# Patient Record
Sex: Male | Born: 1965 | Race: Black or African American | Hispanic: No | Marital: Single | Smoking: Never smoker
Health system: Southern US, Community
[De-identification: ages and names within clinical notes are randomized; demographics above are authoritative.]

---

## 2005-03-24 ENCOUNTER — Ambulatory Visit (HOSPITAL_COMMUNITY): Admission: RE | Admit: 2005-03-24 | Discharge: 2005-03-24 | Payer: Self-pay | Admitting: General Surgery

## 2005-08-10 ENCOUNTER — Emergency Department (HOSPITAL_COMMUNITY): Admission: EM | Admit: 2005-08-10 | Discharge: 2005-08-10 | Payer: Self-pay | Admitting: Emergency Medicine

## 2005-08-15 ENCOUNTER — Emergency Department (HOSPITAL_COMMUNITY): Admission: EM | Admit: 2005-08-15 | Discharge: 2005-08-15 | Payer: Self-pay | Admitting: Emergency Medicine

## 2006-04-29 ENCOUNTER — Ambulatory Visit: Payer: Self-pay | Admitting: Family Medicine

## 2006-04-30 ENCOUNTER — Ambulatory Visit: Payer: Self-pay | Admitting: Family Medicine

## 2006-05-06 ENCOUNTER — Ambulatory Visit: Payer: Self-pay | Admitting: Gastroenterology

## 2006-05-08 ENCOUNTER — Ambulatory Visit: Payer: Self-pay | Admitting: Family Medicine

## 2006-05-11 ENCOUNTER — Ambulatory Visit: Payer: Self-pay | Admitting: Family Medicine

## 2006-05-14 ENCOUNTER — Ambulatory Visit (HOSPITAL_COMMUNITY): Admission: RE | Admit: 2006-05-14 | Discharge: 2006-05-14 | Payer: Self-pay | Admitting: *Deleted

## 2006-05-29 ENCOUNTER — Ambulatory Visit: Payer: Self-pay | Admitting: Gastroenterology

## 2006-06-12 ENCOUNTER — Ambulatory Visit: Payer: Self-pay | Admitting: Gastroenterology

## 2006-07-30 DIAGNOSIS — K921 Melena: Secondary | ICD-10-CM

## 2008-07-23 IMAGING — US US RENAL
1 series · 14 of 25 positions shown · non-contrast
Comparison: none

CLINICAL DATA: Chronic renal failure. 
 RENAL ULTRASOUND:

[Series 1: unknown · 0.33mm/px · 14 of 25 slices shown]
[im 1/25]
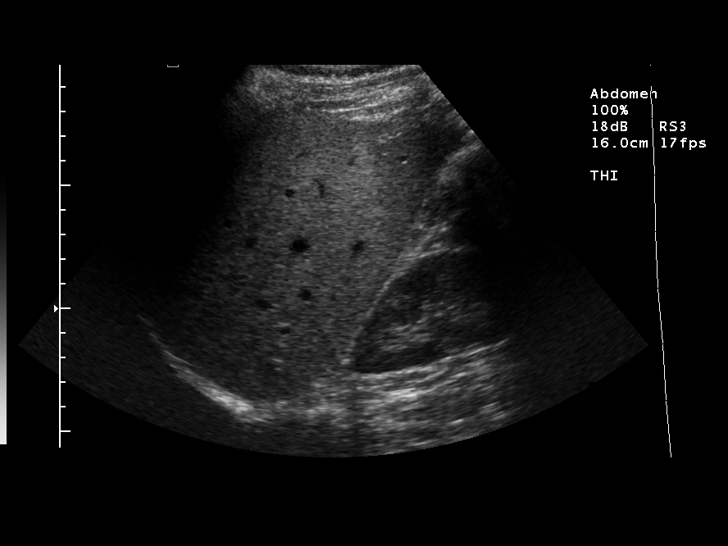
[im 3/25]
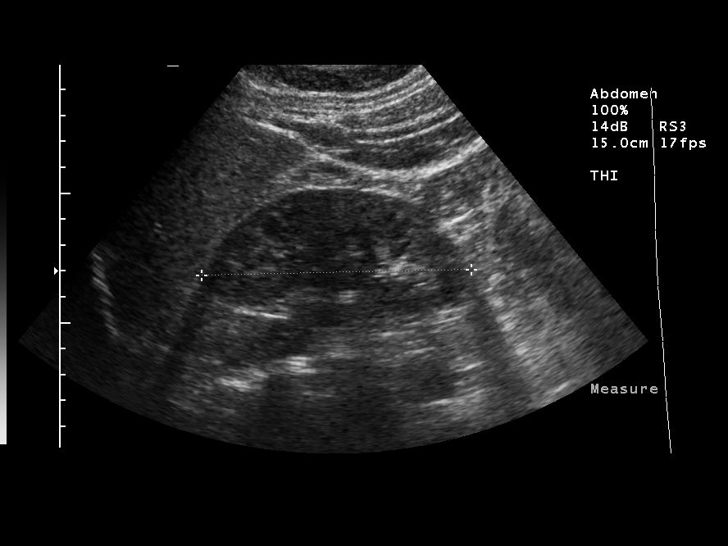
[im 5/25]
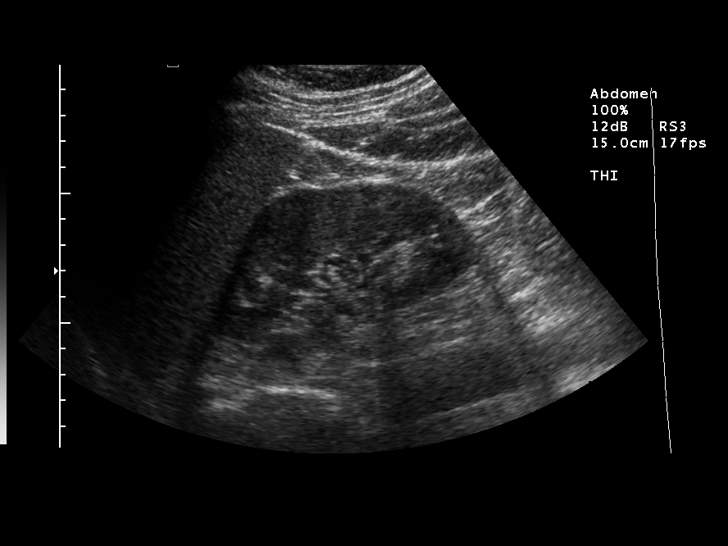
[im 7/25]
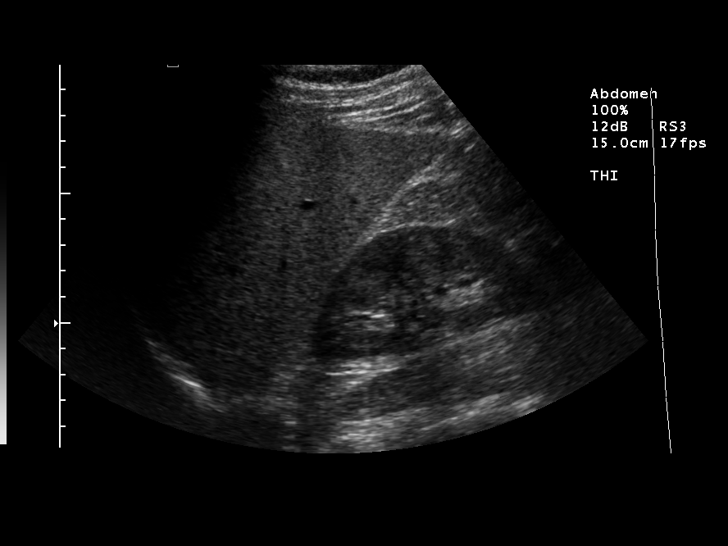
[im 9/25]
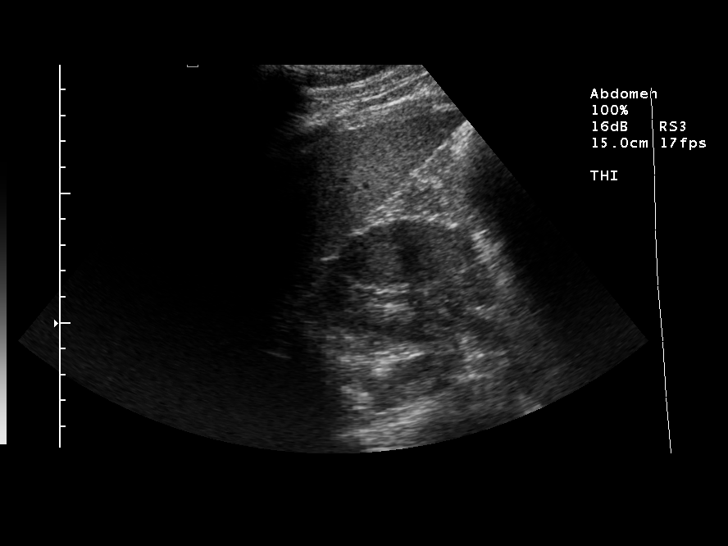
[im 10/25]
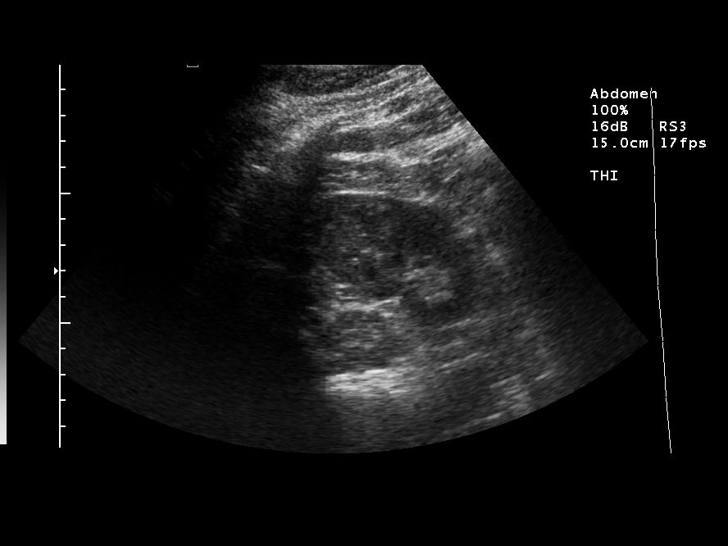
[im 12/25]
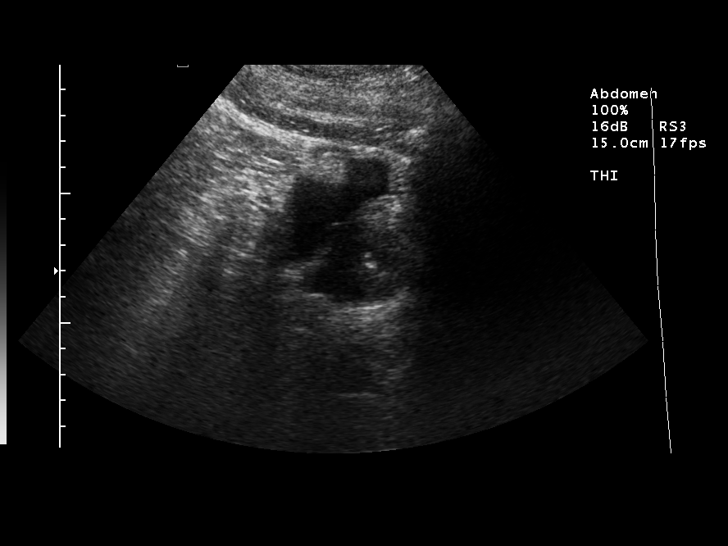
[im 14/25]
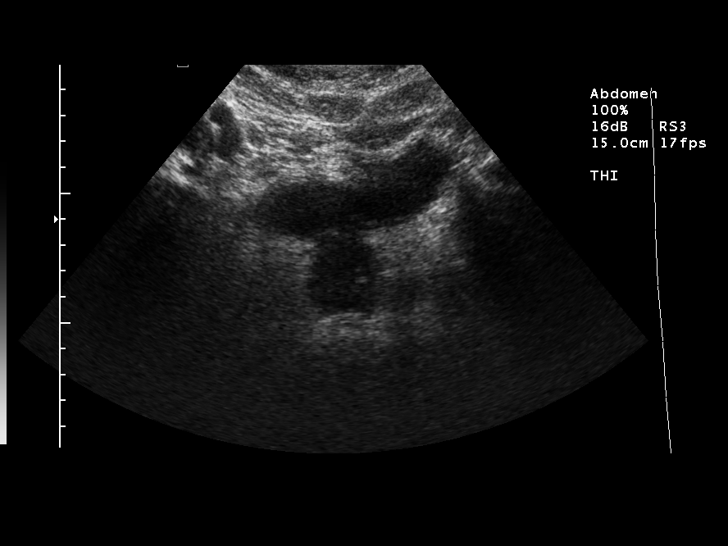
[im 16/25]
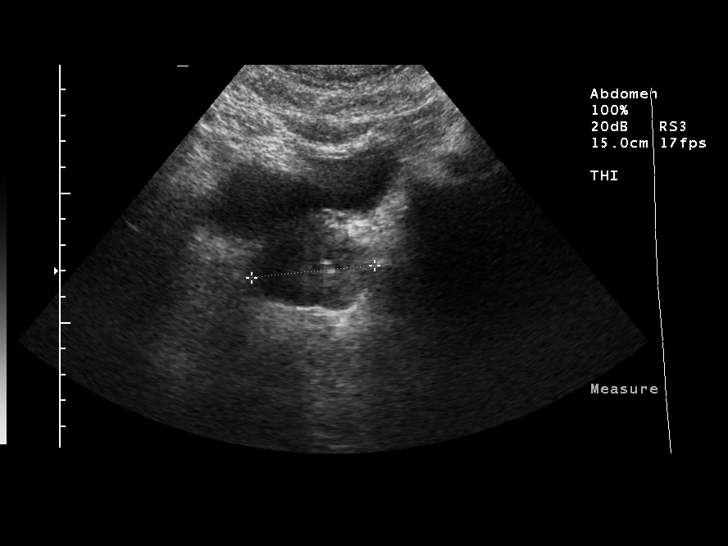
[im 17/25]
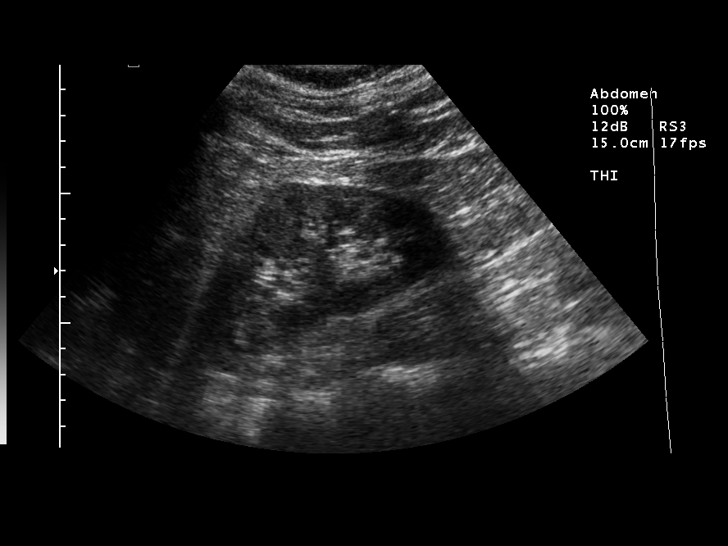
[im 19/25]
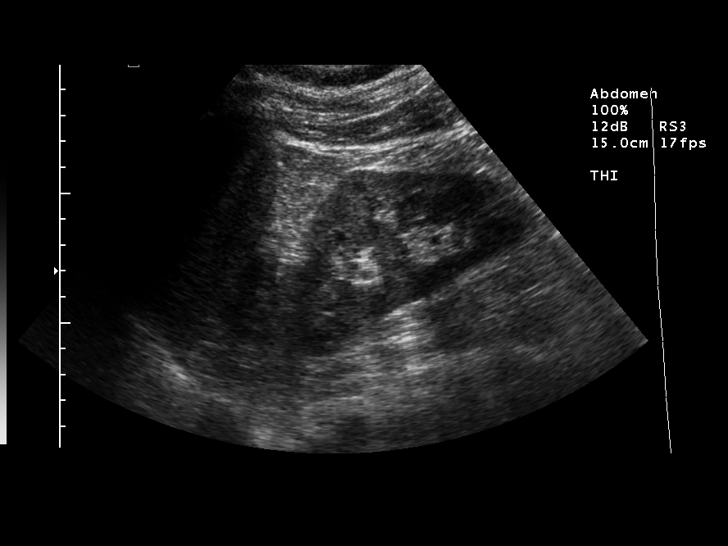
[im 21/25]
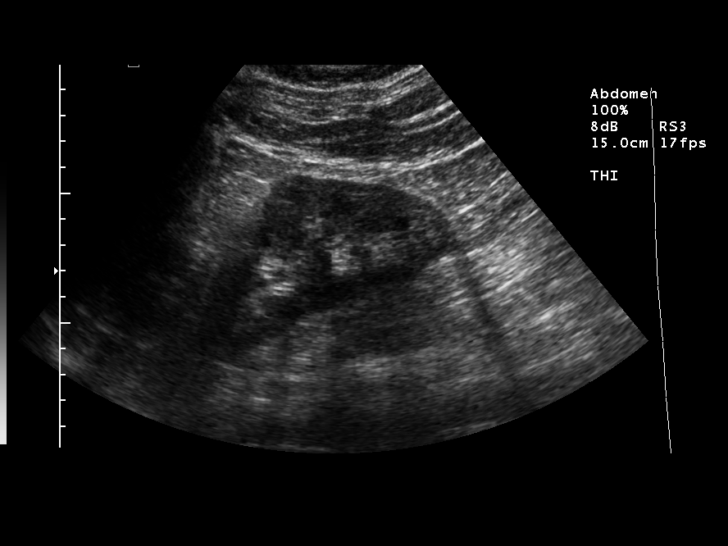
[im 23/25]
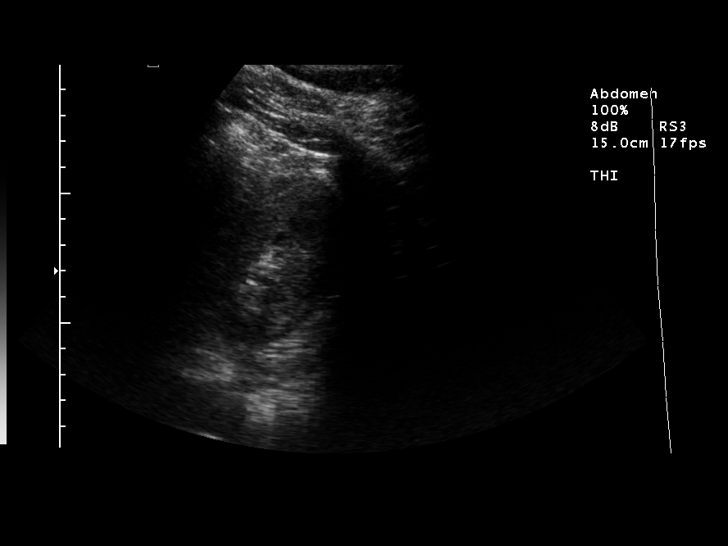
[im 25/25]
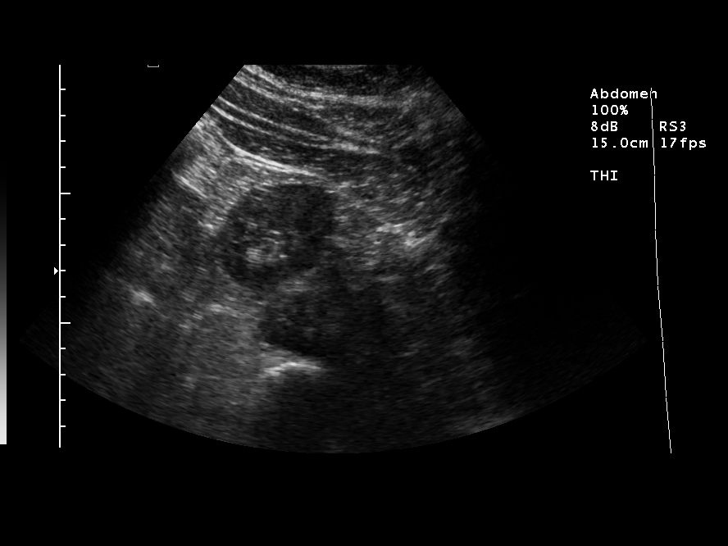

[14 of 25 positions shown; findings below may reference images not displayed]

FINDINGS: The right and left kidneys measure 10.6 cm and 10.7 cm in length, respectively. No mass or hydronephrosis. Renal parenchymal echogenicity appears within normal limits. Enlarged prostate gland measuring 3.4 x 4.2 x 4.8 cm.  The bladder is difficult to evaluate because it is not distended.
IMPRESSION: Renal ultrasound within normal limits. Enlarged prostate gland.

## 2014-12-14 ENCOUNTER — Encounter: Payer: Self-pay | Admitting: Gastroenterology

## 2015-02-12 ENCOUNTER — Ambulatory Visit (INDEPENDENT_AMBULATORY_CARE_PROVIDER_SITE_OTHER): Payer: Self-pay | Admitting: Emergency Medicine

## 2015-02-12 VITALS — BP 142/84 | HR 77 | Temp 98.4°F | Resp 18 | Ht 71.5 in | Wt 222.0 lb

## 2015-02-12 DIAGNOSIS — Z024 Encounter for examination for driving license: Secondary | ICD-10-CM

## 2015-02-12 DIAGNOSIS — Z021 Encounter for pre-employment examination: Secondary | ICD-10-CM

## 2015-02-12 NOTE — Progress Notes (Signed)
Subjective:  Patient ID: Carlos Weeks, male    DOB: January 09, 1966  Age: 49 y.o. MRN: [id number]  CC: Annual Exam   HPI Carlos Weeks presents  DOT  History Sallie has no past medical history on file.   He has no past surgical history on file.   His  family history includes Diabetes in his father; Heart attack in his father; Hypertension in his mother.  He   reports that he has never smoked. He does not have any smokeless tobacco history on file. He reports that he does not drink alcohol or use illicit drugs.  No outpatient prescriptions prior to visit.   No facility-administered medications prior to visit.    Social History   Social History  . Marital Status: Single    Spouse Name: N/A  . Number of Children: N/A  . Years of Education: N/A   Social History Main Topics  . Smoking status: Never Smoker   . Smokeless tobacco: None  . Alcohol Use: No  . Drug Use: No  . Sexual Activity: No   Other Topics Concern  . None   Social History Narrative  . None     Review of Systems  Constitutional: Negative for fever, chills and appetite change.  HENT: Negative for congestion, ear pain, postnasal drip, sinus pressure and sore throat.   Eyes: Negative for pain and redness.  Respiratory: Negative for cough, shortness of breath and wheezing.   Cardiovascular: Negative for leg swelling.  Gastrointestinal: Negative for nausea, vomiting, abdominal pain, diarrhea, constipation and blood in stool.  Endocrine: Negative for polyuria.  Genitourinary: Negative for dysuria, urgency, frequency and flank pain.  Musculoskeletal: Negative for gait problem.  Skin: Negative for rash.  Neurological: Negative for weakness and headaches.  Psychiatric/Behavioral: Negative for confusion and decreased concentration. The patient is not nervous/anxious.     Objective:  Pulse 77  Temp(Src) 98.4 F (36.9 C) (Oral)  Resp 18  Ht 5' 11.5" (1.816 m)  Wt 222 lb (100.699 kg)  BMI  30.53 kg/m2  SpO2 98%  Physical Exam  Constitutional: He is oriented to person, place, and time. He appears well-developed and well-nourished. No distress.  HENT:  Head: Normocephalic and atraumatic.  Right Ear: External ear normal.  Left Ear: External ear normal.  Nose: Nose normal.  Eyes: Conjunctivae and EOM are normal. Pupils are equal, round, and reactive to light. No scleral icterus.  Neck: Normal range of motion. Neck supple. No tracheal deviation present.  Cardiovascular: Normal rate, regular rhythm and normal heart sounds.   Pulmonary/Chest: Effort normal. No respiratory distress. He has no wheezes. He has no rales.  Abdominal: He exhibits no mass. There is no tenderness. There is no rebound and no guarding.  Musculoskeletal: He exhibits no edema.  Lymphadenopathy:    He has no cervical adenopathy.  Neurological: He is alert and oriented to person, place, and time. Coordination normal.  Skin: Skin is warm and dry. No rash noted.  Psychiatric: He has a normal mood and affect. His behavior is normal.      Assessment & Plan:   Audry was seen today for annual exam.  Diagnoses and all orders for this visit:  Encounter for commercial driver medical examination (CDME)   I am having Mr. Derner maintain his naproxen sodium.  Meds ordered this encounter  Medications  . naproxen sodium (ANAPROX) 220 MG tablet    Sig: Take 220 mg by mouth 2 (two) times daily with a meal.  Appropriate red flag conditions were discussed with the patient as well as actions that should be taken.  Patient expressed his understanding.  Follow-up: Return if symptoms worsen or fail to improve.  Roselee Culver, MD

## 2015-05-31 ENCOUNTER — Other Ambulatory Visit (HOSPITAL_COMMUNITY)
Admission: RE | Admit: 2015-05-31 | Discharge: 2015-05-31 | Disposition: A | Discharge status: Home / Self Care | Payer: Self-pay | Source: Ambulatory Visit | Attending: Family Medicine | Admitting: Family Medicine

## 2015-05-31 ENCOUNTER — Encounter (HOSPITAL_COMMUNITY): Payer: Self-pay | Admitting: Emergency Medicine

## 2015-05-31 ENCOUNTER — Emergency Department (INDEPENDENT_AMBULATORY_CARE_PROVIDER_SITE_OTHER)
Admission: EM | Admit: 2015-05-31 | Discharge: 2015-05-31 | Disposition: A | Discharge status: Home / Self Care | Payer: Self-pay | Source: Home / Self Care | Attending: Family Medicine | Admitting: Family Medicine

## 2015-05-31 DIAGNOSIS — Z113 Encounter for screening for infections with a predominantly sexual mode of transmission: Secondary | ICD-10-CM | POA: Insufficient documentation

## 2015-05-31 DIAGNOSIS — N453 Epididymo-orchitis: Secondary | ICD-10-CM

## 2015-05-31 DIAGNOSIS — N451 Epididymitis: Secondary | ICD-10-CM

## 2015-05-31 DIAGNOSIS — N452 Orchitis: Secondary | ICD-10-CM

## 2015-05-31 LAB — POCT URINALYSIS DIP (DEVICE)
Bilirubin Urine: NEGATIVE
Glucose, UA: 500 mg/dL — AB
Ketones, ur: NEGATIVE mg/dL
LEUKOCYTES UA: NEGATIVE
NITRITE: NEGATIVE
PH: 5.5 (ref 5.0–8.0)
Protein, ur: 100 mg/dL — AB
Specific Gravity, Urine: 1.02 (ref 1.005–1.030)
UROBILINOGEN UA: 0.2 mg/dL (ref 0.0–1.0)

## 2015-05-31 MED ORDER — LIDOCAINE HCL (PF) 1 % IJ SOLN
INTRAMUSCULAR | Status: AC
  Filled 2015-05-31: qty 5

## 2015-05-31 MED ORDER — CEFTRIAXONE SODIUM 1 G IJ SOLR
1.0000 g | Freq: Once | INTRAMUSCULAR | Status: AC
  Administered 2015-05-31: 1 g via INTRAMUSCULAR

## 2015-05-31 MED ORDER — CEFTRIAXONE SODIUM 1 G IJ SOLR
INTRAMUSCULAR | Status: AC
  Filled 2015-05-31: qty 10

## 2015-05-31 MED ORDER — CIPROFLOXACIN HCL 500 MG PO TABS: 500.0000 mg | ORAL_TABLET | Freq: Two times a day (BID) | ORAL | Status: DC

## 2015-05-31 NOTE — ED Notes (Signed)
Left testicular pain and inflammation per patient.  Onset 2 days ago of symptoms.

## 2015-05-31 NOTE — ED Provider Notes (Signed)
CSN: [id number]     Arrival date & time 05/31/15  1515 History   First MD Initiated Contact with Patient 05/31/15 1800     Chief Complaint  Patient presents with  . Testicle Pain   (Consider location/radiation/quality/duration/timing/severity/associated sxs/prior Treatment) HPI Comments: 49 year old male with gradual onset of left testicular pain for 2 days. This was not an acute event. It is worse with movement, ambulation, lying down with legs crossed. Occasionally he will have mild dysuria. Denies frequency. Denies penile discharge. Denies trauma or any known injury. The pain is much improved when lying on his back and legs are separated. 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 These types of movement exacerbates the pain.    History reviewed. No pertinent past medical history. History reviewed. No pertinent past surgical history. Family History  Problem Relation Age of Onset  . Hypertension Mother   . Diabetes Father   . Heart attack Father    Social History  Substance Use Topics  . Smoking status: Never Smoker   . Smokeless tobacco: None  . Alcohol Use: No    Review of Systems  Constitutional: Positive for fever.  HENT: Negative.   Respiratory: Negative.   Genitourinary: Positive for dysuria, scrotal swelling and testicular pain. Negative for urgency, frequency, hematuria, discharge, penile swelling and penile pain.  Musculoskeletal: Negative.   Skin: Negative for rash.  Neurological: Negative.   Psychiatric/Behavioral: Negative.   All other systems reviewed and are negative.   Allergies  Review of patient's allergies indicates no known allergies.  Home Medications   Prior to Admission medications   Medication Sig Start Date End Date Taking? Authorizing Provider  ciprofloxacin (CIPRO) 500 MG tablet Take 1 tablet (500 mg total) by  mouth 2 (two) times daily. 05/31/15   Hayden Rasmussen, NP   Meds Ordered and Administered this Visit   Medications  cefTRIAXone (ROCEPHIN) injection 1 g (not administered)    BP 166/102 mmHg  Pulse 105  Temp(Src) 101.1 F (38.4 C) (Oral)  Resp 16  SpO2 99% No data found.   Physical Exam  Constitutional: He is oriented to person, place, and time. He appears well-developed and well-nourished. No distress.  Neck: Normal range of motion. Neck supple.  Cardiovascular: Normal rate.   Pulmonary/Chest: Effort normal. No respiratory distress.  Genitourinary: Penis normal. No penile tenderness.  Right testicle of normal size and configuration. Nontender. No tenderness to the right epididymal apparatus. Left testicle enlarged approx twice normal size, firm and exquisitely tender. There is also tenderness to the epididymal apparatus and to the posterior aspect of the left testicle.  No inguinal bulging or palpated mass. No inguinal lymphadenopathy.  Musculoskeletal: He exhibits no edema.  Neurological: He is alert and oriented to person, place, and time.  Skin: Skin is warm and dry.  Psychiatric: He has a normal mood and affect. His behavior is normal.  Nursing note and vitals reviewed.   ED Course  Procedures (including critical care time)  Labs Review Labs Reviewed  POCT URINALYSIS DIP (DEVICE) - Abnormal; Notable for the following:    Glucose, UA 500 (*)    Hgb urine dipstick TRACE (*)    Protein, ur 100 (*)    All other components within normal limits  URINE CYTOLOGY ANCILLARY ONLY   Results for orders placed or performed during the hospital encounter  of 05/31/15  POCT urinalysis dip (device)  Result Value Ref Range   Glucose, UA 500 (A) NEGATIVE mg/dL   Bilirubin Urine NEGATIVE NEGATIVE   Ketones, ur NEGATIVE NEGATIVE mg/dL   Specific Gravity, Urine 1.020 1.005 - 1.030   Hgb urine dipstick TRACE (A) NEGATIVE   pH 5.5 5.0 - 8.0   Protein, ur 100 (A) NEGATIVE mg/dL    Urobilinogen, UA 0.2 0.0 - 1.0 mg/dL   Nitrite NEGATIVE NEGATIVE   Leukocytes, UA NEGATIVE NEGATIVE     Imaging Review No results found.   Visual Acuity Review  Right Eye Distance:   Left Eye Distance:   Bilateral Distance:    Right Eye Near:   Left Eye Near:    Bilateral Near:         MDM   1. Orchitis, epididymitis, and epididymo-orchitis   Your urine had glucose in it. F/U with your PCP for additional testing for elevated blood sugar or diabetes. I consulted with Dr.Budzyn, urologist. Agreed this is likely orchitis/epididymitis. Treat with Rocephin 1 g IM now and Cipro 500 mg twice a day. If not improving or if getting worse in the next 2-3 days go directly to the department or if needed tomorrow call his office to see promptly. Care instructions as below were given. Treatment of orchitis depends on the cause. For orchitis caused by a bacterial infection, your health care provider will most likely prescribe antibiotic medicines. Bacterial infections usually clear up within a few days. Both viral infections and bacterial infections may be treated with:  Bed rest.  Anti-inflammatory medicines.  Pain medicines.  Elevating the scrotum and applying ice. HOME CARE INSTRUCTIONS  Rest as directed by your health care provider.  Take medicines only as directed by your health care provider.  If you were prescribed an antibiotic medicine, finish it all even if you start to feel better.  Elevate your scrotum and apply ice as directed:  Put ice in a plastic bag.  Place a small towel or pillow between your legs.  Rest your scrotum on the pillow or towel.  Place another towel between your skin and the plastic bag.  Leave the ice on for 20 minutes, 2-3 times a day. SEEK MEDICAL CARE IF:  You have a fever.  Pain and swelling have not gotten better after 3 days. SEEK IMMEDIATE MEDICAL CARE IF:  Your pain is getting worse.  The swelling in your testicle gets  worse.      Hayden Rasmussenavid Sarit Sparano, NP 05/31/15 1905

## 2015-05-31 NOTE — Discharge Instructions (Signed)
Epididymitis °Epididymitis is swelling (inflammation) of the epididymis. The epididymis is a cord-like structure that is located along the top and back part of the testicle. It collects and stores sperm from the testicle. °This condition can also cause pain and swelling of the testicle and scrotum. Symptoms usually start suddenly (acute epididymitis). Sometimes epididymitis starts gradually and lasts for a while (chronic epididymitis). This type may be harder to treat. °CAUSES °In men 35 and younger, this condition is usually caused by a bacterial infection or sexually transmitted disease (STD), such as: °· Gonorrhea. °· Chlamydia.   °In men 35 and older who do not have anal sex, this condition is usually caused by bacteria from a blockage or abnormalities in the urinary system. These can result from: °· Having a tube placed into the bladder (urinary catheter). °· Having an enlarged or inflamed prostate gland. °· Having recent urinary tract surgery. °In men who have a condition that weakens the body's defense system (immune system), such as HIV, this condition can be caused by:  °· Other bacteria, including tuberculosis and syphilis. °· Viruses. °· Fungi. °Sometimes this condition occurs without infection. That may happen if urine flows backward into the epididymis after heavy lifting or straining. °RISK FACTORS °This condition is more likely to develop in men: °· Who have unprotected sex with more than one partner. °· Who have anal sex.   °· Who have recently had surgery.   °· Who have a urinary catheter. °· Who have urinary problems. °· Who have a suppressed immune system.   °SYMPTOMS  °This condition usually begins suddenly with chills, fever, and pain behind the scrotum and in the testicle. Other symptoms include:  °· Swelling of the scrotum, testicle, or both. °· Pain when ejaculating or urinating. °· Pain in the back or belly. °· Nausea. °· Itching and discharge from the penis. °· Frequent need to pass  urine. °· Redness and tenderness of the scrotum. °DIAGNOSIS °Your health care provider can diagnose this condition based on your symptoms and medical history. Your health care provider will also do a physical exam to ask about your symptoms and check your scrotum and testicle for swelling, pain, and redness. You may also have other tests, including:   °· Examination of discharge from the penis. °· Urine tests for infections, such as STDs.   °Your health care provider may test you for other STDs, including HIV.  °TREATMENT °Treatment for this condition depends on the cause. If your condition is caused by a bacterial infection, oral antibiotic medicine may be prescribed. If the bacterial infection has spread to your blood, you may need to receive IV antibiotics. Nonbacterial epididymitis is treated with home care that includes bed rest and elevation of the scrotum. °Surgery may be needed to treat: °· Bacterial epididymitis that causes pus to build up in the scrotum (abscess). °· Chronic epididymitis that has not responded to other treatments. °HOME CARE INSTRUCTIONS °Medicines  °· Take over-the-counter and prescription medicines only as told by your health care provider.   °· If you were prescribed an antibiotic medicine, take it as told by your health care provider. Do not stop taking the antibiotic even if your condition improves. °Sexual Activity  °· If your epididymitis was caused by an STD, avoid sexual activity until your treatment is complete. °· Inform your sexual partner or partners if you test positive for an STD. They may need to be treated. Do not engage in sexual activity with your partner or partners until their treatment is completed. °General Instructions  °· Return to your normal activities as told   by your health care provider. Ask your health care provider what activities are safe for you. °· Keep your scrotum elevated and supported while resting. Ask your health care provider if you should wear a  scrotal support, such as a jockstrap. Wear it as told by your health care provider. °· If directed, apply ice to the affected area:   °¨ Put ice in a plastic bag. °¨ Place a towel between your skin and the bag. °¨ Leave the ice on for 20 minutes, 2-3 times per day. °· Try taking a sitz bath to help with discomfort. This is a warm water bath that is taken while you are sitting down. The water should only come up to your hips and should cover your buttocks. Do this 3-4 times per day or as told by your health care provider. °· Keep all follow-up visits as told by your health care provider. This is important. °SEEK MEDICAL CARE IF:  °· You have a fever.   °· Your pain medicine is not helping.   °· Your pain is getting worse.   °· Your symptoms do not improve within three days. °  °This information is not intended to replace advice given to you by your health care provider. Make sure you discuss any questions you have with your health care provider. °  °Document Released: 05/16/2000 Document Revised: 02/07/2015 Document Reviewed: 10/04/2014 °Elsevier Interactive Patient Education ©2016 [employer] ° °Orchitis °Orchitis is swelling (inflammation) of a testicle caused by infection. Testicles are the male organs that produce sperm. The testicles are held in a fleshy sac (scrotum) located behind the penis. Orchitis usually affects only one testicle, but it can occur in both. The condition can develop suddenly. °Orchitis can be caused by many different kinds of bacteria and viruses. °CAUSES °Orchitis can be caused by either a bacterial or viral infection. °Bacterial Infections °· These often occur along with an infection of the coiled tube that collects sperm and sits on top of the testicle (epididymis). °· In men who are not sexually active, bacterial orchitis usually starts as a urinary tract infection and spreads to the testicle. °· In sexually active men, sexually transmitted infections are the most common cause of  bacterial orchitis. These can include: °¨ Gonorrhea. °¨ Chlamydia. °Viral Infections °· Mumps is still the most common cause of viral orchitis, though mumps is now rare in many areas because of vaccination. °· Other viruses that can cause orchitis include: °¨ The chickenpox virus (varicella-zoster virus). °¨ The virus that causes mononucleosis (Epstein-Barr virus). °RISK FACTORS °Boys and men who have not been vaccinated against mumps are at risk for mumps orchitis. °Risk factors for bacterial orchitis include: °· Frequent urinary tract infections. °· High-risk sexual behaviors. °· Having a sexual partner with a sexually transmitted infection. °· Having had urinary tract surgery. °· Using a tube passed through the penis to drain urine (Foley catheter). °· An enlarged prostate gland. °SIGNS AND SYMPTOMS °The most common symptoms of orchitis are swelling and pain in the scrotum. Other signs and symptoms may include: °· Feeling generally sick (malaise). °· Fever and chills. °· Painful urination. °· Painful ejaculation. °· Blood or discharge from the penis. °· Nausea. °· Headache. °· Fatigue. °DIAGNOSIS °Your health care provider may suspect orchitis if you have a painful, swollen testicle along with other signs and symptoms of the condition. A physical exam will be done. Tests may also be done to help your health care provider make a diagnosis. These may include: °· A blood test   to check for signs of infection. °· A urine test to check for a urinary tract infection. °· Using a swab to collect a fluid sample from the tip of the penis to test for sexually transmitted infections. °· Taking an image of the testicle using sound waves and a computer (testicular ultrasound). °TREATMENT °Treatment of orchitis depends on the cause. For orchitis caused by a bacterial infection, your health care provider will most likely prescribe antibiotic medicines. Bacterial infections usually clear up within a few days. °Both viral  infections and bacterial infections may be treated with: °· Bed rest. °· Anti-inflammatory medicines. °· Pain medicines. °· Elevating the scrotum and applying ice. °HOME CARE INSTRUCTIONS °· Rest as directed by your health care provider. °· Take medicines only as directed by your health care provider. °· If you were prescribed an antibiotic medicine, finish it all even if you start to feel better. °· Elevate your scrotum and apply ice as directed: °¨ Put ice in a plastic bag. °¨ Place a small towel or pillow between your legs. °¨ Rest your scrotum on the pillow or towel. °¨ Place another towel between your skin and the plastic bag. °¨ Leave the ice on for 20 minutes, 2-3 times a day. °SEEK MEDICAL CARE IF: °· You have a fever. °· Pain and swelling have not gotten better after 3 days. °SEEK IMMEDIATE MEDICAL CARE IF: °· Your pain is getting worse. °· The swelling in your testicle gets worse. °  °This information is not intended to replace advice given to you by your health care provider. Make sure you discuss any questions you have with your health care provider. °  °Document Released: 05/16/2000 Document Revised: 06/09/2014 Document Reviewed: 10/06/2013 °Elsevier Interactive Patient Education ©2016 [employer] ° °

## 2015-06-02 LAB — URINE CYTOLOGY ANCILLARY ONLY
Chlamydia: NEGATIVE
NEISSERIA GONORRHEA: NEGATIVE
Trichomonas: NEGATIVE

## 2015-06-05 ENCOUNTER — Emergency Department (HOSPITAL_COMMUNITY)
Admission: EM | Admit: 2015-06-05 | Discharge: 2015-06-05 | Disposition: A | Discharge status: Home / Self Care | Payer: Self-pay | Attending: Physician Assistant | Admitting: Physician Assistant

## 2015-06-05 ENCOUNTER — Encounter (HOSPITAL_COMMUNITY): Payer: Self-pay

## 2015-06-05 ENCOUNTER — Emergency Department (HOSPITAL_COMMUNITY): Payer: Self-pay

## 2015-06-05 DIAGNOSIS — N50819 Testicular pain, unspecified: Secondary | ICD-10-CM

## 2015-06-05 DIAGNOSIS — N453 Epididymo-orchitis: Secondary | ICD-10-CM | POA: Insufficient documentation

## 2015-06-05 LAB — URINE MICROSCOPIC-ADD ON: RBC / HPF: NONE SEEN RBC/hpf (ref 0–5)

## 2015-06-05 LAB — URINALYSIS, ROUTINE W REFLEX MICROSCOPIC
BILIRUBIN URINE: NEGATIVE
Glucose, UA: 500 mg/dL — AB
Hgb urine dipstick: NEGATIVE
KETONES UR: 15 mg/dL — AB
LEUKOCYTES UA: NEGATIVE
NITRITE: NEGATIVE
PH: 5.5 (ref 5.0–8.0)
PROTEIN: 100 mg/dL — AB
Specific Gravity, Urine: 1.027 (ref 1.005–1.030)

## 2015-06-05 MED ORDER — OXYCODONE-ACETAMINOPHEN 5-325 MG PO TABS: 1.0000 | ORAL_TABLET | Freq: Four times a day (QID) | ORAL | Status: DC | PRN

## 2015-06-05 MED ORDER — OXYCODONE-ACETAMINOPHEN 5-325 MG PO TABS
1.0000 | ORAL_TABLET | Freq: Once | ORAL | Status: AC
  Administered 2015-06-05: 1 via ORAL
  Filled 2015-06-05: qty 1

## 2015-06-05 MED ORDER — CIPROFLOXACIN HCL 500 MG PO TABS: 500.0000 mg | ORAL_TABLET | Freq: Two times a day (BID) | ORAL | Status: DC

## 2015-06-05 NOTE — Discharge Instructions (Signed)
Wear tight briefs, take antibiotics for at least two weeks before it will get better. Follow up with urology. Use work note until you feel improved.    Orchitis Orchitis is swelling (inflammation) of a testicle caused by infection. Testicles are the male organs that produce sperm. The testicles are held in a fleshy sac (scrotum) located behind the penis. Orchitis usually affects only one testicle, but it can occur in both. The condition can develop suddenly. Orchitis can be caused by many different kinds of bacteria and viruses. CAUSES Orchitis can be caused by either a bacterial or viral infection. Bacterial Infections  These often occur along with an infection of the coiled tube that collects sperm and sits on top of the testicle (epididymis).  In men who are not sexually active, bacterial orchitis usually starts as a urinary tract infection and spreads to the testicle.  In sexually active men, sexually transmitted infections are the most common cause of bacterial orchitis. These can include:  Gonorrhea.  Chlamydia. Viral Infections  Mumps is still the most common cause of viral orchitis, though mumps is now rare in many areas because of vaccination.  Other viruses that can cause orchitis include:  The chickenpox virus (varicella-zoster virus).  The virus that causes mononucleosis (Epstein-Barr virus). RISK FACTORS Boys and men who have not been vaccinated against mumps are at risk for mumps orchitis. Risk factors for bacterial orchitis include:  Frequent urinary tract infections.  High-risk sexual behaviors.  Having a sexual partner with a sexually transmitted infection.  Having had urinary tract surgery.  Using a tube passed through the penis to drain urine (Foley catheter).  An enlarged prostate gland. SIGNS AND SYMPTOMS The most common symptoms of orchitis are swelling and pain in the scrotum. Other signs and symptoms may include:  Feeling generally sick  (malaise).  Fever and chills.  Painful urination.  Painful ejaculation.  Blood or discharge from the penis.  Nausea.  Headache.  Fatigue. DIAGNOSIS Your health care provider may suspect orchitis if you have a painful, swollen testicle along with other signs and symptoms of the condition. A physical exam will be done. Tests may also be done to help your health care provider make a diagnosis. These may include:  A blood test to check for signs of infection.  A urine test to check for a urinary tract infection.  Using a swab to collect a fluid sample from the tip of the penis to test for sexually transmitted infections.  Taking an image of the testicle using sound waves and a computer (testicular ultrasound). TREATMENT Treatment of orchitis depends on the cause. For orchitis caused by a bacterial infection, your health care provider will most likely prescribe antibiotic medicines. Bacterial infections usually clear up within a few days. Both viral infections and bacterial infections may be treated with:  Bed rest.  Anti-inflammatory medicines.  Pain medicines.  Elevating the scrotum and applying ice. HOME CARE INSTRUCTIONS  Rest as directed by your health care provider.  Take medicines only as directed by your health care provider.  If you were prescribed an antibiotic medicine, finish it all even if you start to feel better.  Elevate your scrotum and apply ice as directed:  Put ice in a plastic bag.  Place a small towel or pillow between your legs.  Rest your scrotum on the pillow or towel.  Place another towel between your skin and the plastic bag.  Leave the ice on for 20 minutes, 2-3 times a day. SEEK  MEDICAL CARE IF:  You have a fever.  Pain and swelling have not gotten better after 3 days. SEEK IMMEDIATE MEDICAL CARE IF:  Your pain is getting worse.  The swelling in your testicle gets worse.   This information is not intended to replace advice  given to you by your health care provider. Make sure you discuss any questions you have with your health care provider.   Document Released: 05/16/2000 Document Revised: 06/09/2014 Document Reviewed: 10/06/2013 Elsevier Interactive Patient Education [employer]

## 2015-06-05 NOTE — ED Notes (Signed)
Pt transported to and from ultrasound via stretcher with tech, tolerated well, reports continued pain on arrival back to room.  Pt rates pain 7/10.

## 2015-06-05 NOTE — ED Provider Notes (Signed)
CSN: [id number]     Arrival date & time 06/05/15  [id number] History   First MD Initiated Contact with Patient 06/05/15 [phone]     No chief complaint on file.    (Consider location/radiation/quality/duration/timing/severity/associated sxs/prior Treatment) HPI   Patient is a 50 year old male presenting with left testicular swelling. Patient had left testicular pain and swelling since last Thursday. He came here to the emergency departmetn last Friday and was diagnosed with orchitis. Started on Cipro twice a day area patient says that he's been taking it and had felt mild improvement. He feels like the pain has gotten worse and a little bit stronger today and he feels unable to go back to work. 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  History reviewed. No pertinent past medical history. History reviewed. No pertinent past surgical history. Family History  Problem Relation Age of Onset  . Hypertension Mother   . Diabetes Father   . Heart attack Father    Social History  Substance Use Topics  . Smoking status: Never Smoker   . Smokeless tobacco: None  . Alcohol Use: No    Review of Systems  Constitutional: Negative for fever and activity change.  Respiratory: Negative for shortness of breath.   Cardiovascular: Negative for chest pain.  Gastrointestinal: Negative for abdominal pain.  Genitourinary: Positive for scrotal swelling and testicular pain. Negative for dysuria, hematuria, discharge, penile swelling, genital sores and penile pain.  Allergic/Immunologic: Negative for immunocompromised state.      Allergies  Review of patient's allergies indicates no known allergies.  Home Medications   Prior to Admission medications   Medication Sig Start Date End Date Taking? Authorizing Provider  ciprofloxacin (CIPRO) 500 MG tablet Take 1 tablet (500 mg total) by mouth 2 (two) times daily. 05/31/15  Yes Hayden Rasmussenavid Mabe, NP  Naproxen Sodium (ALEVE) 220 MG CAPS Take 220 mg by mouth daily as needed  (pain/fever/headache).   Yes Historical Provider, MD   BP 167/101 mmHg  Pulse 92  Temp(Src) 99.1 F (37.3 C) (Oral)  Resp 18  Ht 5\' 10"  (1.778 m)  Wt 230 lb (104.327 kg)  BMI 33.00 kg/m2  SpO2 97% Physical Exam  Constitutional: He is oriented to person, place, and time. He appears well-nourished.  HENT:  Head: Normocephalic.  Mouth/Throat: Oropharynx is clear and moist.  Eyes: Conjunctivae are normal.  Neck: No tracheal deviation present.  Cardiovascular: Normal rate.   Pulmonary/Chest: Effort normal. No stridor.  Abdominal: Soft. There is no tenderness.  Genitourinary:  Penis normal no discharge no lesions.  Right testicle normal no pain or swelling.  Left testicle enlarged, swollen. Diffusely tender, worse in epididymis.  Musculoskeletal: Normal range of motion. He exhibits no edema.  Neurological: He is oriented to person, place, and time. No cranial nerve deficit.  Skin: Skin is warm and dry. No rash noted. He is not diaphoretic.  Psychiatric: He has a normal mood and affect. His behavior is normal.  Nursing note and vitals reviewed.   ED Course  Procedures (including critical care time) Labs Review Labs Reviewed  URINALYSIS, ROUTINE W REFLEX MICROSCOPIC (NOT AT [facility]) - Abnormal; Notable for the following:    Glucose, UA 500 (*)    Ketones, ur 15 (*)    Protein, ur 100 (*)    All other components within normal limits  URINE MICROSCOPIC-ADD ON - Abnormal; Notable for the following:    Squamous Epithelial / LPF 0-5 (*)    Bacteria, UA RARE (*)    Casts HYALINE  CASTS (*)    All other components within normal limits  GC/CHLAMYDIA PROBE AMP ([place]) NOT AT [facility]    Imaging Review US Scrotum  06/05/2015  CLINICAL DATA:  Left scrotal pain for 5 day EXAM: SCROTAL ULTRASOUND DOPPLER ULTRASOUND OF THE TESTICLES TECHNIQUE: Complete ultrasound examination of the testicles, epididymis, and other scrotal structures was performed. Color and spectral Doppler ultrasound  were also utilized to evaluate blood flow to the testicles. COMPARISON:  None. FINDINGS: Right testicle Measurements: 4.5 x 2.6 x 3.4 cm. No mass or microlithiasis visualized. Left testicle Measurements: 5.2 x 3.4 x 4.2 cm. Color Doppler imaging demonstrates to increased vascularity throughout the left testicle. No mass or microlithiasis visualized. Right epididymis:  Normal in size and appearance. Left epididymis: There is increased vascularity throughout the left epididymis based on color flow imaging. No mass. Hydrocele: Bilateral moderate hydrocele. There is echogenic signal within the right hydrocele. There are septations within the left hydrocele. Varicocele: Bilateral varicocele is present left greater than right. Pulsed Doppler interrogation of both testes demonstrates normal low resistance arterial and venous waveforms bilaterally. IMPRESSION: No evidence of testicular torsion. Left testicle is slightly enlarged and hyperemic. Left epididymis is hyperemic. These findings suggest left epididymo-orchitis. Correlation with urinalysis is recommended Bilateral hydrocele. The right hydrocele contains echogenic fluid and there are septations within left hydrocele. This is compatible with complicated hydrocele and can be seen with inflammatory process ease or prior hematoma. Bilateral varicocele left greater than right. Electronically Signed   By: Jolaine Click M.D.   On: 06/05/2015 11:55   [place] Art/ven Flow Abd Pelv Doppler  06/05/2015  CLINICAL DATA:  Left scrotal pain for 5 day EXAM: SCROTAL ULTRASOUND DOPPLER ULTRASOUND OF THE TESTICLES TECHNIQUE: Complete ultrasound examination of the testicles, epididymis, and other scrotal structures was performed. Color and spectral Doppler ultrasound were also utilized to evaluate blood flow to the testicles. COMPARISON:  None. FINDINGS: Right testicle Measurements: 4.5 x 2.6 x 3.4 cm. No mass or microlithiasis visualized. Left testicle Measurements: 5.2 x 3.4 x 4.2 cm.  Color Doppler imaging demonstrates to increased vascularity throughout the left testicle. No mass or microlithiasis visualized. Right epididymis:  Normal in size and appearance. Left epididymis: There is increased vascularity throughout the left epididymis based on color flow imaging. No mass. Hydrocele: Bilateral moderate hydrocele. There is echogenic signal within the right hydrocele. There are septations within the left hydrocele. Varicocele: Bilateral varicocele is present left greater than right. Pulsed Doppler interrogation of both testes demonstrates normal low resistance arterial and venous waveforms bilaterally. IMPRESSION: No evidence of testicular torsion. Left testicle is slightly enlarged and hyperemic. Left epididymis is hyperemic. These findings suggest left epididymo-orchitis. Correlation with urinalysis is recommended Bilateral hydrocele. The right hydrocele contains echogenic fluid and there are septations within left hydrocele. This is compatible with complicated hydrocele and can be seen with inflammatory process ease or prior hematoma. Bilateral varicocele left greater than right. Electronically Signed   By: Jolaine Click M.D.   On: 06/05/2015 11:55   I have personally reviewed and evaluated these images and lab results as part of my medical decision-making.   EKG Interpretation None      MDM   Final diagnoses:  Testicular pain    Patient is a 50 year old male presenting with left testicular swelling and pain. Patient diagnosed with orchitis and treated with Cipro twice a day for the last 6 days. Patient has noticed mild improvement but still has significant swelling and is unable to work.  Patient  started wearing tight briefs yesterday said it improved greatly.  Although I think this is likely still or orchitis we will get an ultrasound given the patient's second visit to the emergency department.  We'll touch base with urology make sure there is no additional in antibiotic  coverage that we should cover him with. Of note patient is an MSM but has not had sexual activity in the last 2 years.  1:14 PM US shows erpidy/orchitis. Discussed with urology. They report this often gets worse before better and requires multiple week course of abx. They agree to continue Cipro. No antibiotic change. We'll have him wear tight boxer's, pain control and off work. We'll have him follow-up with urology.  Danyeal Akens Randall An, MD 06/05/15 1315

## 2015-06-05 NOTE — ED Notes (Addendum)
Pt presents with 1.5 week h/o L testicular pain.  Pt seen at Urgent Care and diagnosed with orchitis, but pt reports pain and swelling have increased.  Pt reports it feels "like I have 1 nut, not 2", denies a\ny difficulty ejaculating or voiding. Pt reports L sided lower abdominal pain.  Reports that testicular pain is radiating into his rectum causing pain with sitting and having bowel movement.

## 2015-06-06 LAB — GC/CHLAMYDIA PROBE AMP (~~LOC~~) NOT AT ARMC
Chlamydia: NEGATIVE
Neisseria Gonorrhea: NEGATIVE

## 2017-03-13 ENCOUNTER — Ambulatory Visit (INDEPENDENT_AMBULATORY_CARE_PROVIDER_SITE_OTHER): Payer: Self-pay | Admitting: Physician Assistant

## 2017-03-13 ENCOUNTER — Encounter: Payer: Self-pay | Admitting: Physician Assistant

## 2017-03-13 VITALS — BP 136/84 | HR 81 | Temp 97.6°F | Resp 16 | Ht 70.0 in | Wt 217.0 lb

## 2017-03-13 DIAGNOSIS — Z0289 Encounter for other administrative examinations: Secondary | ICD-10-CM

## 2017-03-13 NOTE — Progress Notes (Signed)
Commercial Driver Medical Examination   Carlos Weeks is a 51 y.o. male with no PMH who presents today for a commercial driver fitness determination physical exam. The patient reports no problems today. In the past the patient reports receiving 2 year certificates. He denies focal neurological deficits, vision and hearing changes. He denies the habitual use of benzodiazepines, opioids, amphetamines and denies illicit drug use.   Current medications, family history, allergies, social history reviewed by me and exist elsewhere in the encounter.   Review of Systems  Constitutional: Negative for chills, diaphoresis and fever.  Eyes: Negative.   Respiratory: Negative for cough, hemoptysis, sputum production, shortness of breath and wheezing.   Cardiovascular: Negative for chest pain, orthopnea and leg swelling.  Gastrointestinal: Negative for abdominal pain, blood in stool, constipation, diarrhea, heartburn, melena, nausea and vomiting.  Genitourinary: Negative for flank pain.  Skin: Negative for rash.  Neurological: Negative for dizziness, sensory change, speech change, focal weakness and headaches.    Objective:     Vision/hearing:  Visual Acuity Screening   Right eye Left eye Both eyes  Without correction:     With correction:  Comments: Color Vision-passed Field of Vision-Passed 85 degrees bilateral  Hearing Screening Comments: Whisper Test passed at 10 feet bilateral  Applicant can recognize and distinguish among traffic control signals and devices showing standard red, green, and amber colors.  Corrective lenses required: Yes  Monocular Vision?: No  Hearing aid requirement: No  Physical Exam  Constitutional: He is oriented to person, place, and time. He appears well-developed. He is active and cooperative.  Non-toxic appearance.  HENT:  Right Ear: Hearing, tympanic membrane, external ear and ear canal normal.  Left Ear: Hearing, tympanic  membrane, external ear and ear canal normal.  Nose: Nose normal. Right sinus exhibits no maxillary sinus tenderness and no frontal sinus tenderness. Left sinus exhibits no maxillary sinus tenderness and no frontal sinus tenderness.  Mouth/Throat: Uvula is midline, oropharynx is clear and moist and mucous membranes are normal. No oropharyngeal exudate, posterior oropharyngeal edema or tonsillar abscesses.  Eyes: Pupils are equal, round, and reactive to light. Conjunctivae and EOM are normal.  Cardiovascular: Normal rate, regular rhythm, S1 normal, S2 normal, normal heart sounds, intact distal pulses and normal pulses.  Exam reveals no gallop and no friction rub.   No murmur heard. Pulmonary/Chest: Effort normal. No stridor. No tachypnea. No respiratory distress. He has no wheezes. He has no rales.  Abdominal: Soft. Normal appearance and bowel sounds are normal. He exhibits no distension and no mass. There is no tenderness. There is no rigidity, no rebound, no guarding and no CVA tenderness. No hernia.  Musculoskeletal: He exhibits no edema.  Lymphadenopathy:       Head (right side): No submandibular and no tonsillar adenopathy present.       Head (left side): No submandibular and no tonsillar adenopathy present.    He has no cervical adenopathy.  Neurological: He is alert and oriented to person, place, and time. He has normal strength and normal reflexes. He is not disoriented. No cranial nerve deficit or sensory deficit. He exhibits normal muscle tone. Coordination and gait normal.  Skin: Skin is warm and dry. He is not diaphoretic. No pallor.  Psychiatric: His behavior is normal.  Vitals reviewed.   BP 136/84 (BP Location: Left Arm, Patient Position: Sitting, Cuff Size: Normal)   Pulse 81   Temp 97.6 F (36.4 C) (Oral)   Resp 16   Ht   (1.778 m)   Wt 217 lb (98.4 kg)   SpO2 100%   BMI 31.14 kg/m   Labs: Comments: UA: SG- 1.000; Protein-Neg; Blood-Neg; Glucose-Neg  Assessment:      Healthy male exam.  Meets standards in 73 CFR 391.41;  qualifies for 2 year certificate.    Plan:    Medical examiners certificate completed and printed. Return as needed.    Deliah Boston, MS, PA-C 9:37 AM, 03/13/2017

## 2019-08-29 ENCOUNTER — Ambulatory Visit: Payer: Self-pay | Attending: Internal Medicine

## 2019-08-29 DIAGNOSIS — Z23 Encounter for immunization: Secondary | ICD-10-CM

## 2019-08-29 NOTE — Progress Notes (Signed)
   Covid-19 Vaccination Clinic  Name:  Carlos Weeks    MRN: [id number] DOB: 19-Oct-1965  08/29/2019  Mr. Carlos Weeks was observed post Covid-19 immunization for 15 minutes without incident. He was provided with Vaccine Information Sheet and instruction to access the V-Safe system.   Mr. Carlos Weeks was instructed to call 911 with any severe reactions post vaccine: Marland Kitchen Difficulty breathing  . Swelling of face and throat  . A fast heartbeat  . A bad rash all over body  . Dizziness and weakness   Immunizations Administered    Name Date Dose VIS Date Route   Pfizer COVID-19 Vaccine 08/29/2019  3:56 PM 0.3 mL 05/13/2019 Intramuscular   Manufacturer: [employer], [employer]   Lot: [id number]   NDC: [address]-3

## 2019-09-21 ENCOUNTER — Ambulatory Visit: Payer: Self-pay | Attending: Internal Medicine

## 2019-09-21 DIAGNOSIS — Z23 Encounter for immunization: Secondary | ICD-10-CM

## 2019-09-21 NOTE — Progress Notes (Signed)
   Covid-19 Vaccination Clinic  Name:  Carlos Weeks    MRN: [id number] DOB: [birth year]  09/21/2019  Carlos Weeks was observed post Covid-19 immunization for 15 minutes without incident. He was provided with Vaccine Information Sheet and instruction to access the V-Safe system.   Carlos Weeks was instructed to call 911 with any severe reactions post vaccine: Marland Kitchen Difficulty breathing  . Swelling of face and throat  . A fast heartbeat  . A bad rash all over body  . Dizziness and weakness   Immunizations Administered    Name Date Dose VIS Date Route   Pfizer COVID-19 Vaccine 09/21/2019  3:21 PM 0.3 mL 07/27/2018 Intramuscular   Manufacturer: [employer], [employer]   Lot: [id number]   NDC: [address]-7

## 2019-09-21 NOTE — Progress Notes (Signed)
   Covid-19 Vaccination Clinic  Name:  Carlos Weeks    MRN: [id number] DOB: 07/12/1965  09/21/2019  Mr. Turck was observed post Covid-19 immunization for 15 minutes without incident. He was provided with Vaccine Information Sheet and instruction to access the V-Safe system.   Mr. Przybylski was instructed to call 911 with any severe reactions post vaccine: . Difficulty breathing  . Swelling of face and throat  . A fast heartbeat  . A bad rash all over body  . Dizziness and weakness   Immunizations Administered    Name Date Dose VIS Date Route   Pfizer COVID-19 Vaccine 09/21/2019  3:21 PM 0.3 mL 07/27/2018 Intramuscular   Manufacturer: Pfizer, Inc   Lot: [id number]   NDC: [address]-2     

## 2020-05-18 ENCOUNTER — Ambulatory Visit: Payer: Self-pay

## 2020-05-19 ENCOUNTER — Ambulatory Visit: Payer: Self-pay

## 2020-05-19 ENCOUNTER — Ambulatory Visit: Payer: Self-pay | Attending: Internal Medicine

## 2020-05-19 DIAGNOSIS — Z23 Encounter for immunization: Secondary | ICD-10-CM

## 2020-05-19 NOTE — Progress Notes (Signed)
° °  Covid-19 Vaccination Clinic  Name:  Carlos Weeks    MRN: [id number] DOB: 03-06-1966  05/19/2020  Mr. [name] was observed post Covid-19 immunization for 15 minutes without incident. He was provided with Vaccine Information Sheet and instruction to access the V-Safe system.   Mr. [name] was instructed to call 911 with any severe reactions post vaccine:  Difficulty breathing   Swelling of face and throat   A fast heartbeat   A bad rash all over body   Dizziness and weakness   Immunizations Administered    Name Date Dose VIS Date Route   Pfizer COVID-19 Vaccine 05/19/2020  1:03 PM 0.3 mL 03/21/2020 Intramuscular   Manufacturer: [employer], [employer]   Lot: [id number]   NDC: [address]-6

## 2022-07-18 ENCOUNTER — Ambulatory Visit: Payer: Self-pay | Admitting: Dietician

## 2023-10-01 ENCOUNTER — Ambulatory Visit: Payer: Self-pay | Admitting: Family Medicine

## 2023-10-01 ENCOUNTER — Ambulatory Visit: Payer: Self-pay | Admitting: Internal Medicine
